# Patient Record
Sex: Male | Born: 1985 | Race: White | Hispanic: No | Marital: Single | Smoking: Never smoker
Health system: Southern US, Community
[De-identification: ages and names within clinical notes are randomized; demographics above are authoritative.]

## PROBLEM LIST (undated history)

## (undated) ENCOUNTER — Emergency Department (HOSPITAL_COMMUNITY): Admission: EM | Payer: MEDICAID | Source: Home / Self Care

---

## 2013-10-25 DIAGNOSIS — S62329A Displaced fracture of shaft of unspecified metacarpal bone, initial encounter for closed fracture: Secondary | ICD-10-CM | POA: Insufficient documentation

## 2013-10-25 DIAGNOSIS — Y929 Unspecified place or not applicable: Secondary | ICD-10-CM | POA: Insufficient documentation

## 2013-10-25 DIAGNOSIS — S63056A Dislocation of other carpometacarpal joint of unspecified hand, initial encounter: Secondary | ICD-10-CM | POA: Insufficient documentation

## 2013-10-25 DIAGNOSIS — W2209XA Striking against other stationary object, initial encounter: Secondary | ICD-10-CM | POA: Insufficient documentation

## 2013-10-26 ENCOUNTER — Ambulatory Visit (HOSPITAL_COMMUNITY)
Admission: EM | Admit: 2013-10-26 | Discharge: 2013-10-26 | Disposition: A | Discharge status: Home / Self Care | Payer: Self-pay | Attending: Emergency Medicine | Admitting: Emergency Medicine

## 2013-10-26 ENCOUNTER — Emergency Department (HOSPITAL_COMMUNITY): Payer: Self-pay | Attending: Emergency Medicine

## 2013-10-26 ENCOUNTER — Encounter (HOSPITAL_COMMUNITY)
Admission: EM | Disposition: A | Discharge status: Home / Self Care | Payer: Self-pay | Source: Home / Self Care | Attending: Emergency Medicine

## 2013-10-26 ENCOUNTER — Encounter (HOSPITAL_COMMUNITY): Payer: Self-pay | Admitting: Emergency Medicine

## 2013-10-26 ENCOUNTER — Emergency Department (HOSPITAL_COMMUNITY): Payer: Self-pay | Admitting: Certified Registered"

## 2013-10-26 ENCOUNTER — Encounter (HOSPITAL_COMMUNITY): Payer: Self-pay | Admitting: Certified Registered"

## 2013-10-26 DIAGNOSIS — S6291XA Unspecified fracture of right wrist and hand, initial encounter for closed fracture: Secondary | ICD-10-CM

## 2013-10-26 HISTORY — PX: PERCUTANEOUS PINNING: SHX2209

## 2013-10-26 HISTORY — PX: I & D EXTREMITY: SHX5045

## 2013-10-26 SURGERY — PINNING, EXTREMITY, PERCUTANEOUS
Anesthesia: General | Laterality: Right

## 2013-10-26 MED ORDER — CEFAZOLIN SODIUM-DEXTROSE 2-3 GM-% IV SOLR
INTRAVENOUS | Status: DC | PRN
  Administered 2013-10-26: 2 g via INTRAVENOUS

## 2013-10-26 MED ORDER — DEXAMETHASONE SODIUM PHOSPHATE 4 MG/ML IJ SOLN
INTRAMUSCULAR | Status: DC | PRN
  Administered 2013-10-26: 4 mg via INTRAVENOUS

## 2013-10-26 MED ORDER — MIDAZOLAM HCL 5 MG/5ML IJ SOLN
INTRAMUSCULAR | Status: DC | PRN
  Administered 2013-10-26: 2 mg via INTRAVENOUS

## 2013-10-26 MED ORDER — LIDOCAINE HCL (CARDIAC) 20 MG/ML IV SOLN
INTRAVENOUS | Status: DC | PRN
  Administered 2013-10-26: 100 mg via INTRAVENOUS

## 2013-10-26 MED ORDER — BUPIVACAINE HCL (PF) 0.25 % IJ SOLN
INTRAMUSCULAR | Status: AC
  Filled 2013-10-26: qty 30

## 2013-10-26 MED ORDER — SODIUM CHLORIDE 0.9 % IR SOLN
Status: DC | PRN
  Administered 2013-10-26: 1000 mL

## 2013-10-26 MED ORDER — SUFENTANIL CITRATE 50 MCG/ML IV SOLN
INTRAVENOUS | Status: DC | PRN
  Administered 2013-10-26: 10 ug via INTRAVENOUS
  Administered 2013-10-26: 20 ug via INTRAVENOUS

## 2013-10-26 MED ORDER — ONDANSETRON HCL 4 MG/2ML IJ SOLN
4.0000 mg | Freq: Four times a day (QID) | INTRAMUSCULAR | Status: AC | PRN
  Administered 2013-10-26: 4 mg via INTRAVENOUS

## 2013-10-26 MED ORDER — LIDOCAINE HCL (CARDIAC) 20 MG/ML IV SOLN
INTRAVENOUS | Status: AC
  Filled 2013-10-26: qty 5

## 2013-10-26 MED ORDER — BUPIVACAINE HCL (PF) 0.25 % IJ SOLN
INTRAMUSCULAR | Status: DC | PRN
  Administered 2013-10-26: 10 mL

## 2013-10-26 MED ORDER — ONDANSETRON HCL 4 MG/2ML IJ SOLN
INTRAMUSCULAR | Status: AC
  Filled 2013-10-26: qty 2

## 2013-10-26 MED ORDER — MORPHINE SULFATE 4 MG/ML IJ SOLN
4.0000 mg | Freq: Once | INTRAMUSCULAR | Status: AC
  Administered 2013-10-26: 4 mg via INTRAVENOUS
  Filled 2013-10-26: qty 1

## 2013-10-26 MED ORDER — HYDROMORPHONE HCL PF 1 MG/ML IJ SOLN: 0.2500 mg | INTRAMUSCULAR | Status: DC | PRN

## 2013-10-26 MED ORDER — SUFENTANIL CITRATE 50 MCG/ML IV SOLN
INTRAVENOUS | Status: AC
  Filled 2013-10-26: qty 1

## 2013-10-26 MED ORDER — ONDANSETRON HCL 4 MG/2ML IJ SOLN
INTRAMUSCULAR | Status: DC | PRN
  Administered 2013-10-26: 4 mg via INTRAVENOUS

## 2013-10-26 MED ORDER — PROPOFOL 10 MG/ML IV BOLUS
INTRAVENOUS | Status: DC | PRN
  Administered 2013-10-26: 200 mg via INTRAVENOUS

## 2013-10-26 MED ORDER — PROPOFOL 10 MG/ML IV BOLUS
INTRAVENOUS | Status: AC
  Filled 2013-10-26: qty 20

## 2013-10-26 MED ORDER — OXYCODONE HCL 5 MG/5ML PO SOLN: 5.0000 mg | Freq: Once | ORAL | Status: DC | PRN

## 2013-10-26 MED ORDER — DEXAMETHASONE SODIUM PHOSPHATE 4 MG/ML IJ SOLN
INTRAMUSCULAR | Status: AC
  Filled 2013-10-26: qty 1

## 2013-10-26 MED ORDER — SODIUM CHLORIDE 0.9 % IJ SOLN
INTRAMUSCULAR | Status: AC
  Filled 2013-10-26: qty 10

## 2013-10-26 MED ORDER — LACTATED RINGERS IV SOLN
INTRAVENOUS | Status: DC | PRN
  Administered 2013-10-26: 04:00:00 via INTRAVENOUS

## 2013-10-26 MED ORDER — SUCCINYLCHOLINE CHLORIDE 20 MG/ML IJ SOLN
INTRAMUSCULAR | Status: DC | PRN
  Administered 2013-10-26: 100 mg via INTRAVENOUS

## 2013-10-26 MED ORDER — SUCCINYLCHOLINE CHLORIDE 20 MG/ML IJ SOLN
INTRAMUSCULAR | Status: AC
  Filled 2013-10-26: qty 1

## 2013-10-26 MED ORDER — MIDAZOLAM HCL 2 MG/2ML IJ SOLN
INTRAMUSCULAR | Status: AC
  Filled 2013-10-26: qty 2

## 2013-10-26 MED ORDER — OXYCODONE HCL 5 MG PO TABS: 5.0000 mg | ORAL_TABLET | Freq: Once | ORAL | Status: DC | PRN

## 2013-10-26 MED ORDER — OXYCODONE-ACETAMINOPHEN 5-325 MG PO TABS: ORAL_TABLET | ORAL | Status: AC

## 2013-10-26 SURGICAL SUPPLY — 75 items
BANDAGE COBAN STERILE 2 (GAUZE/BANDAGES/DRESSINGS) IMPLANT
BANDAGE CONFORM 2  STR LF (GAUZE/BANDAGES/DRESSINGS) IMPLANT
BANDAGE ELASTIC 3 VELCRO ST LF (GAUZE/BANDAGES/DRESSINGS) ×3 IMPLANT
BANDAGE ELASTIC 4 VELCRO ST LF (GAUZE/BANDAGES/DRESSINGS) ×3 IMPLANT
BANDAGE GAUZE ELAST BULKY 4 IN (GAUZE/BANDAGES/DRESSINGS) ×3 IMPLANT
BENZOIN TINCTURE PRP APPL 2/3 (GAUZE/BANDAGES/DRESSINGS) ×3 IMPLANT
BLADE 10 SAFETY STRL DISP (BLADE) ×3 IMPLANT
BLADE SURG ROTATE 9660 (MISCELLANEOUS) ×3 IMPLANT
BNDG COHESIVE 1X5 TAN STRL LF (GAUZE/BANDAGES/DRESSINGS) IMPLANT
BNDG ESMARK 4X9 LF (GAUZE/BANDAGES/DRESSINGS) ×3 IMPLANT
CLOSURE WOUND 1/2 X4 (GAUZE/BANDAGES/DRESSINGS) ×1
CORDS BIPOLAR (ELECTRODE) ×3 IMPLANT
COVER SURGICAL LIGHT HANDLE (MISCELLANEOUS) ×3 IMPLANT
CUFF TOURNIQUET SINGLE 18IN (TOURNIQUET CUFF) ×3 IMPLANT
CUFF TOURNIQUET SINGLE 24IN (TOURNIQUET CUFF) IMPLANT
DECANTER SPIKE VIAL GLASS SM (MISCELLANEOUS) ×3 IMPLANT
DRAIN PENROSE 1/4X12 LTX STRL (WOUND CARE) IMPLANT
DRAPE C-ARM MINI 42X72 WSTRAPS (DRAPES) ×3 IMPLANT
DRAPE OEC MINIVIEW 54X84 (DRAPES) ×3 IMPLANT
DRAPE SURG 17X23 STRL (DRAPES) ×3 IMPLANT
DRSG ADAPTIC 3X8 NADH LF (GAUZE/BANDAGES/DRESSINGS) IMPLANT
DRSG EMULSION OIL 3X3 NADH (GAUZE/BANDAGES/DRESSINGS) ×3 IMPLANT
DRSG PAD ABDOMINAL 8X10 ST (GAUZE/BANDAGES/DRESSINGS) ×6 IMPLANT
DURAPREP 26ML APPLICATOR (WOUND CARE) ×3 IMPLANT
GAUZE XEROFORM 1X8 LF (GAUZE/BANDAGES/DRESSINGS) ×3 IMPLANT
GLOVE BIO SURGEON STRL SZ7.5 (GLOVE) ×6 IMPLANT
GLOVE BIOGEL M STER SZ 6 (GLOVE) ×3 IMPLANT
GLOVE BIOGEL PI IND STRL 7.5 (GLOVE) ×1 IMPLANT
GLOVE BIOGEL PI IND STRL 8 (GLOVE) ×1 IMPLANT
GLOVE BIOGEL PI INDICATOR 7.5 (GLOVE) ×2
GLOVE BIOGEL PI INDICATOR 8 (GLOVE) ×2
GOWN BRE IMP PREV XXLGXLNG (GOWN DISPOSABLE) ×3 IMPLANT
GOWN STRL REIN XL XLG (GOWN DISPOSABLE) ×3 IMPLANT
GOWN STRL REUS W/ TWL LRG LVL3 (GOWN DISPOSABLE) ×2 IMPLANT
GOWN STRL REUS W/TWL LRG LVL3 (GOWN DISPOSABLE) ×4
GUIDEWIRE ORTH 6X062XTROC NS (WIRE) ×1 IMPLANT
HANDPIECE INTERPULSE COAX TIP (DISPOSABLE)
K-WIRE .045X45 (WIRE) ×9 IMPLANT
K-WIRE .062 (WIRE) ×2
KIT BASIN OR (CUSTOM PROCEDURE TRAY) ×3 IMPLANT
KIT ROOM TURNOVER OR (KITS) ×3 IMPLANT
LOOP VESSEL MAXI BLUE (MISCELLANEOUS) IMPLANT
LOOP VESSEL MINI RED (MISCELLANEOUS) IMPLANT
MANIFOLD NEPTUNE II (INSTRUMENTS) ×3 IMPLANT
NEEDLE HYPO 25GX1X1/2 BEV (NEEDLE) ×3 IMPLANT
NEEDLE HYPO 25X1 1.5 SAFETY (NEEDLE) IMPLANT
NS IRRIG 1000ML POUR BTL (IV SOLUTION) ×3 IMPLANT
PACK ORTHO EXTREMITY (CUSTOM PROCEDURE TRAY) ×3 IMPLANT
PAD ARMBOARD 7.5X6 YLW CONV (MISCELLANEOUS) ×6 IMPLANT
PAD CAST 4YDX4 CTTN HI CHSV (CAST SUPPLIES) ×1 IMPLANT
PADDING CAST COTTON 4X4 STRL (CAST SUPPLIES) ×2
SCRUB BETADINE 4OZ XXX (MISCELLANEOUS) IMPLANT
SET HNDPC FAN SPRY TIP SCT (DISPOSABLE) IMPLANT
SLING ARM IMMOBILIZER LRG (SOFTGOODS) ×3 IMPLANT
SOLUTION BETADINE 4OZ (MISCELLANEOUS) IMPLANT
SPLINT FIBERGLASS 3X12 (CAST SUPPLIES) ×3 IMPLANT
SPONGE GAUZE 4X4 12PLY (GAUZE/BANDAGES/DRESSINGS) ×3 IMPLANT
SPONGE LAP 18X18 X RAY DECT (DISPOSABLE) ×3 IMPLANT
SPONGE LAP 4X18 X RAY DECT (DISPOSABLE) ×3 IMPLANT
STRIP CLOSURE SKIN 1/2X4 (GAUZE/BANDAGES/DRESSINGS) ×2 IMPLANT
SUCTION FRAZIER TIP 10 FR DISP (SUCTIONS) ×3 IMPLANT
SUT ETHILON 4 0 P 3 18 (SUTURE) IMPLANT
SUT ETHILON 4 0 PS 2 18 (SUTURE) ×3 IMPLANT
SUT MON AB 5-0 P3 18 (SUTURE) IMPLANT
SUT PROLENE 4 0 P 3 18 (SUTURE) IMPLANT
SYR CONTROL 10ML LL (SYRINGE) ×3 IMPLANT
TOWEL OR 17X24 6PK STRL BLUE (TOWEL DISPOSABLE) ×3 IMPLANT
TOWEL OR 17X26 10 PK STRL BLUE (TOWEL DISPOSABLE) ×3 IMPLANT
TUBE ANAEROBIC SPECIMEN COL (MISCELLANEOUS) IMPLANT
TUBE CONNECTING 12'X1/4 (SUCTIONS) ×1
TUBE CONNECTING 12X1/4 (SUCTIONS) ×2 IMPLANT
TUBE FEEDING 5FR 15 INCH (TUBING) IMPLANT
UNDERPAD 30X30 INCONTINENT (UNDERPADS AND DIAPERS) ×3 IMPLANT
WATER STERILE IRR 1000ML POUR (IV SOLUTION) ×3 IMPLANT
YANKAUER SUCT BULB TIP NO VENT (SUCTIONS) ×3 IMPLANT

## 2013-10-26 NOTE — Anesthesia Preprocedure Evaluation (Signed)
Anesthesia Evaluation  Patient identified by MRN, date of birth, ID band Patient awake    Reviewed: Allergy & Precautions, H&P , NPO status , Patient's Chart, lab work & pertinent test results  Airway Mallampati: I  Neck ROM: full    Dental   Pulmonary neg pulmonary ROS,          Cardiovascular negative cardio ROS      Neuro/Psych    GI/Hepatic   Endo/Other    Renal/GU      Musculoskeletal   Abdominal   Peds  Hematology   Anesthesia Other Findings   Reproductive/Obstetrics                           Anesthesia Physical Anesthesia Plan  ASA: I and emergent  Anesthesia Plan: General   Post-op Pain Management:    Induction: Intravenous  Airway Management Planned: Oral ETT  Additional Equipment:   Intra-op Plan:   Post-operative Plan: Extubation in OR  Informed Consent: I have reviewed the patients History and Physical, chart, labs and discussed the procedure including the risks, benefits and alternatives for the proposed anesthesia with the patient or authorized representative who has indicated his/her understanding and acceptance.     Plan Discussed with: CRNA, Anesthesiologist and Surgeon  Anesthesia Plan Comments:         Anesthesia Quick Evaluation

## 2013-10-26 NOTE — ED Notes (Addendum)
Pt to be seen at [facility] ED.  Dr Bebe ShaggyWickline arranged for accepting physician to see pt in ED.

## 2013-10-26 NOTE — Op Note (Signed)
Intra-operative fluoroscopic images in the AP, lateral, and oblique views were taken and evaluated by myself.  Reduction and hardware placement were confirmed.  There was no intraarticular penetration of permanent hardware.  

## 2013-10-26 NOTE — Op Note (Signed)
[id number] 

## 2013-10-26 NOTE — H&P (Signed)
  Margaretha SheffieldRyan Iezzi is an 28 y.o. male.   Chief Complaint: right hand metacarpal fractures HPI: 28 yo rhd male states he punched a wall late last night injuring right hand.  Seen at [facility] where XR revealed right long/ring metacarpal base fractures and small cmc dislocation.  [facility].  Reports no previous injury to right hand and no other injury at this time.  History reviewed. No pertinent past medical history.  History reviewed. No pertinent past surgical history.  No family history on file. Social History:  reports that he has never smoked. He does not have any smokeless tobacco history on file. He reports that he uses illicit drugs (Marijuana). He reports that he does not drink alcohol.  Allergies: No Known Allergies   (Not in a hospital admission)  No results found for this or any previous visit (from the past 48 hour(s)).  Dg Hand Complete Right  10/26/2013   CLINICAL DATA:  Right hand pain with deformity after punching a wall.  EXAM: RIGHT HAND - COMPLETE 3+ VIEW  COMPARISON:  None.  FINDINGS: Fractures of the base of the right fourth and fifth metacarpal bones with dorsal dislocation of the fifth metacarpal with respect to the carpus and the volar angulation of the distal fracture fragments. There is probably also a fracture of the proximal third metacarpal bone with volar angulation. This is best visualized on the lateral view bone overlap.  IMPRESSION: Angulated fractures of the proximal third, fourth, and fifth metacarpal bones with additional dorsal displacement of the fifth carpometacarpal joint.   Electronically Signed   By: Burman NievesWilliam  Stevens M.D.   On: 10/26/2013 00:28     A comprehensive review of systems was negative.  Blood pressure 131/81, pulse 95, temperature 97.9 F (36.6 C), temperature source Oral, resp. rate 18, SpO2 94.00%.  General appearance: alert, cooperative and appears stated age Head: Normocephalic, without obvious abnormality,  atraumatic Neck: supple, symmetrical, trachea midline Resp: clear to auscultation bilaterally Cardio: regular rate and rhythm GI: non tender Extremities: intact sensation and capillary refill all digits.  +epl/fpl/io.  visible deformity right hand.  mild pain with motion of fingers.   Pulses: 2+ and symmetric Skin: Skin color, texture, turgor normal. No rashes or lesions Neurologic: Grossly normal Incision/Wound: none  Assessment/Plan Right small cmc dislocation, ring and long metacarpal base fractures.  Recommend OR for reduction and pinning of fractures/dislocation.  Risks, benefits, and alternatives of surgery were discussed and the patient agrees with the plan of care.  Don't suspect compartment syndrome of hand at this time therefore no plan for fasciotomies.   Tami RibasKevin R Candelario Steppe 10/26/2013, 3:53 AM

## 2013-10-26 NOTE — Discharge Instructions (Signed)

## 2013-10-26 NOTE — ED Provider Notes (Signed)
CSN: [id number]     Arrival date & time 10/25/13  2351 History  This chart was scribed for Joya Gaskinsonald W Griselda Bramblett, MD by Danella Maiersaroline Early, ED Scribe. This patient was seen in room [facility] and the patient's care was started at 12:34 AM.    No chief complaint on file.  Patient is a 28 y.o. male presenting with hand injury. The history is provided by the patient. No language interpreter was used.  Hand Injury  HPI Comments: Dwayne Ortega is a 28 y.o. male who presents to the Emergency Department complaining of constant moderate right hand pain since hitting a wall around 11PM tonight. Isolated injury. He denies head neck back chest or abdominal pain. He had a light dinner hours ago.  PMH - none No family history on file. History  Substance Use Topics  . Smoking status: Never Smoker   . Smokeless tobacco: Not on file  . Alcohol Use: No    Review of Systems  Gastrointestinal: Negative for vomiting.  Musculoskeletal: Positive for arthralgias (right hand).  Skin: Negative for rash.  Neurological: Negative for weakness and numbness.  All other systems reviewed and are negative.     Allergies  Review of patient's allergies indicates no known allergies.  Home Medications   Prior to Admission medications   Not on File   BP 116/80  Pulse 70  Temp(Src) 98.1 F (36.7 C) (Oral)  Resp 18  SpO2 98% Physical Exam CONSTITUTIONAL: Well developed/well nourished HEAD: Normocephalic/atraumatic EYES: EOMI/PERRL ENMT: Mucous membranes moist NECK: supple no meningeal signs SPINE:entire spine nontender CV: S1/S2 noted, no murmurs/rubs/gallops noted LUNGS: Lungs are clear to auscultation bilaterally, no apparent distress ABDOMEN: soft, nontender, no rebound or guarding GU:no cva tenderness NEURO: Pt is awake/alert, moves all extremitiesx4 EXTREMITIES: pulses normal. Swelling and deformity noted to right hand. No lacerations noted. Distal cap refill less than 2 seconds.  No sensory deficit to  fingers on right hand SKIN: warm, color normal PSYCH: no abnormalities of mood noted   ED Course  Procedures  Medications  morphine 4 MG/ML injection 4 mg (4 mg Intravenous Given 10/26/13 0054)  morphine 4 MG/ML injection 4 mg (4 mg Intravenous Given 10/26/13 0151)      COORDINATION OF CARE: 12:37 AM- Discussed treatment plan with pt which includes pain management and consult to surgeon. Pt agrees to plan.  1:37 AM D/w dr Hilda Liaskeeling who has reviewed imaging He requests Hand consultation 2:04 AM D/w dr Betha Loakevin kuzma He has reviewed xray He requests volar splint and transfer to [place] for operative repair Pt will be transferred via private vehicle Pt agreeable for transfer He is currently stable for transfer BP 116/80  Pulse 70  Temp(Src) 98.1 F (36.7 C) (Oral)  Resp 18  SpO2 98%   Imaging Review Dg Hand Complete Right  10/26/2013   CLINICAL DATA:  Right hand pain with deformity after punching a wall.  EXAM: RIGHT HAND - COMPLETE 3+ VIEW  COMPARISON:  None.  FINDINGS: Fractures of the base of the right fourth and fifth metacarpal bones with dorsal dislocation of the fifth metacarpal with respect to the carpus and the volar angulation of the distal fracture fragments. There is probably also a fracture of the proximal third metacarpal bone with volar angulation. This is best visualized on the lateral view bone overlap.  IMPRESSION: Angulated fractures of the proximal third, fourth, and fifth metacarpal bones with additional dorsal displacement of the fifth carpometacarpal joint.   Electronically Signed   By: Chrissie NoaWilliam  Andria MeuseStevens M.D.   On: 10/26/2013 00:28      MDM   Final diagnoses:  Right hand fracture    Nursing notes including past medical history and social history reviewed and considered in documentation xrays reviewed and considered   I personally performed the services described in this documentation, which was scribed in my presence. The recorded information has been  reviewed and is accurate.      Joya Gaskinsonald W Alekhya Gravlin, MD 10/26/13 [phone]

## 2013-10-26 NOTE — Op Note (Signed)
NAMMarland Kitchen:  Dwayne Ortega, Dwayne Ortega                 ACCOUNT NO.:  [id number]  MEDICAL RECORD NO.:  [id number]  LOCATION:  [facility]                         FACILITY:  [facility]  PHYSICIAN:  Betha LoaKevin Citlali Gautney, MD        DATE OF BIRTH:  1986/02/24  DATE OF PROCEDURE:  10/26/2013 DATE OF DISCHARGE:                              OPERATIVE REPORT   PREOPERATIVE DIAGNOSIS:  Right long and ring finger metacarpal fractures and small finger carpometacarpal joint dislocation.  POSTOPERATIVE DIAGNOSIS:  Right long and ring finger metacarpal fractures and small finger carpometacarpal joint dislocation.  PROCEDURE:  Closed reduction and percutaneous pinning of right long and ring finger metacarpal fractures and right small finger carpometacarpal joint dislocation.  SURGEON:  Betha LoaKevin Kron Everton, MD  ASSISTANT:  None.  ANESTHESIA:  General.  IV FLUIDS:  Per anesthesia flow sheet.  ESTIMATED BLOOD LOSS:  Minimal.  COMPLICATIONS:  None.  SPECIMENS:  None.  TOURNIQUET TIME:  29 minutes.  DISPOSITION:  Stable to PACU.  INDICATIONS:  [name] is a 28 year old male who states that last evening he punched a wall, injured his right hand.  [facility] was [facility] in [facility] where radiographs were taken revealing a right small finger CMC dislocation and long and ring finger metacarpal fractures.  I was consulted for management of the injury.  [facility].  On examination, he had intact sensation and capillary refill on the fingertips.  He can flex and extend the IP joint of thumbs across his fingers.  He had no open wounds.  He had visible deformity of the right hand.  Passive motion of the fingers did not cause significant pain.  I recommended operative closed reduction and pinning versus open reduction and fixation of the fractures and dislocation.  Risks, benefits and alternatives of the surgery were discussed including the risk of blood loss; infection; damage to nerves,  vessels, tendons, ligaments, bone; failure of surgery; need for additional surgery; complications with wound healing; continued pain; nonunion; malunion; stiffness.  He voiced understanding of these risks and elected to proceed.  OPERATIVE COURSE:  After being identified preoperatively by myself, the patient and I agreed upon the procedure and site of procedure.  Surgical site was marked.  The risks, benefits, and alternatives of the surgery were reviewed and he wished to proceed.  Surgical consent had been signed.  He was given IV Ancef as preoperative antibiotic prophylaxis. He was transferred to the operating room and placed on the operating room table in supine position with the right upper extremity on an armboard.  General anesthesia was induced by anesthesiologist.  Right upper extremity was prepped and draped in normal sterile orthopedic fashion.  A surgical pause was performed between the surgeons, anesthesia, and operating room staff, and all were in agreement as to the patient, procedure, and site of procedure.  Tourniquet at the proximal aspect of the extremity was inflated to 250 mmHg after exsanguination of the limb with an Esmarch bandage.  Closed reduction of the CMC dislocation in the fractures was performed.  Near-anatomic reduction was obtained.  A 0.062 inch K-wire was then advanced across the [facility]  joint of the small finger.  This was adequate to stabilize the dislocation.  An additional 0.045-inch K-wire was then advanced from the base of small finger metacarpal into the ring finger metacarpal base. 0.045-inch K-wires were then used in a crossed fashion at the ring and long finger metacarpal base fractures.  This provided good stabilization of the fractures.  C-arm was used in AP, lateral, and oblique projections throughout the case to ensure appropriate reduction and position of hardware, which was the case.  The wrist was placed through a tenodesis and there was no  scissoring of the digits.  All pins were bent and cut short.  The pin sites were dressed with sterile Xeroform, 4x4s, and wrapped with a Kerlix bandage.  A volar and dorsal slab splint including the long, ring, and small fingers with the MPs flexed and the IPs extended was placed and wrapped with Kerlix and Ace bandage. Tourniquet was deflated at 29 minutes.  Fingertips were pink with brisk capillary refill after deflation of the tourniquet.  The operative drapes were broken down.  The patient was awoken from anesthesia safely. He was transferred back to stretcher and taken to PACU in stable condition.  I will see him back in the office in 1 week for postoperative followup.  I will give him Percocet 5/325, 1-2 p.o. q.6 hours p.r.n. pain, dispensed #40.     Betha LoaKevin Kellyann Ordway, MD     KK/MEDQ  D:  10/26/2013  T:  10/26/2013  Job:  [id number]

## 2013-10-26 NOTE — ED Provider Notes (Signed)
SPLINT APPLICATION Date/Time: 2:11 AM Authorized by: Joya Gaskinsonald W Dyron Kawano Consent: Verbal consent obtained. Risks and benefits: risks, benefits and alternatives were discussed Consent given by: patient Splint applied by: nurse  Location details: right hand Splint type: volar splint Supplies used: fiberglass Post-procedure: The splinted body part was neurovascularly unchanged following the procedure. Patient tolerance: Patient tolerated the procedure well with no immediate complications.     Joya Gaskinsonald W Kacee Koren, MD 10/26/13 [id number]

## 2013-10-26 NOTE — Transfer of Care (Signed)
Immediate Anesthesia Transfer of Care Note  Patient: Margaretha SheffieldRyan Dascenzo  Procedure(s) Performed: Procedure(s): PERCUTANEOUS PINNING EXTREMITY (Right) IRRIGATION AND DEBRIDEMENT EXTREMITY (Right)  Patient Location: PACU  Anesthesia Type:General  Level of Consciousness: awake, alert , oriented and patient cooperative  Airway & Oxygen Therapy: Patient Spontanous Breathing and Patient connected to nasal cannula oxygen  Post-op Assessment: Report given to PACU RN, Post -op Vital signs reviewed and stable and Patient moving all extremities X 4  Post vital signs: Reviewed and stable  Complications: No apparent anesthesia complications

## 2013-10-26 NOTE — Brief Op Note (Signed)
10/26/2013  5:11 AM  PATIENT:  Dwayne Ortega  28 y.o. male  PRE-OPERATIVE DIAGNOSIS:  fractured right hand  POST-OPERATIVE DIAGNOSIS:  fractured right hand  PROCEDURE:  Procedure(s): PERCUTANEOUS PINNING EXTREMITY (Right) IRRIGATION AND DEBRIDEMENT EXTREMITY (Right)  SURGEON:  Surgeon(s) and Role:    * Tami RibasKevin R Franchelle Foskett, MD - Primary  PHYSICIAN ASSISTANT:   ASSISTANTS: none   ANESTHESIA:   general  EBL:     BLOOD ADMINISTERED:none  DRAINS: none   LOCAL MEDICATIONS USED:  MARCAINE     SPECIMEN:  No Specimen  DISPOSITION OF SPECIMEN:  N/A  COUNTS:  YES  TOURNIQUET:   Total Tourniquet Time Documented: Upper Arm (Right) - 29 minutes Total: Upper Arm (Right) - 29 minutes   DICTATION: .Other Dictation: Dictation Number [id number]  PLAN OF CARE: Discharge to home after PACU  PATIENT DISPOSITION:  PACU - hemodynamically stable.

## 2013-10-26 NOTE — ED Notes (Signed)
Pt states he hit a wall with his right hand approx 1 hour ago, has obvious deformity to same.

## 2013-10-29 NOTE — Anesthesia Postprocedure Evaluation (Signed)
Anesthesia Post Note  Patient: Dwayne SheffieldRyan Coldren  Procedure(s) Performed: Procedure(s) (LRB): PERCUTANEOUS PINNING EXTREMITY (Right) IRRIGATION AND DEBRIDEMENT EXTREMITY (Right)  Anesthesia type: General  Patient location: PACU  Post pain: Pain level controlled and Adequate analgesia  Post assessment: Post-op Vital signs reviewed, Patient's Cardiovascular Status Stable, Respiratory Function Stable, Patent Airway and Pain level controlled  Last Vitals:  Filed Vitals:   10/26/13 0615  BP: 120/78  Pulse: 76  Temp: 36.4 C  Resp: 16    Post vital signs: Reviewed and stable  Level of consciousness: awake, alert  and oriented  Complications: No apparent anesthesia complications

## 2013-10-30 ENCOUNTER — Encounter (HOSPITAL_COMMUNITY): Payer: Self-pay | Admitting: Orthopedic Surgery

## 2015-11-23 IMAGING — CR DG HAND COMPLETE 3+V*R*
4 series · 4 of 4 positions shown · non-contrast
Comparison: None.

CLINICAL DATA: Right hand pain with deformity after punching a
wall.

EXAM:
RIGHT HAND - COMPLETE 3+ VIEW

[view not recorded (1 of 4)]
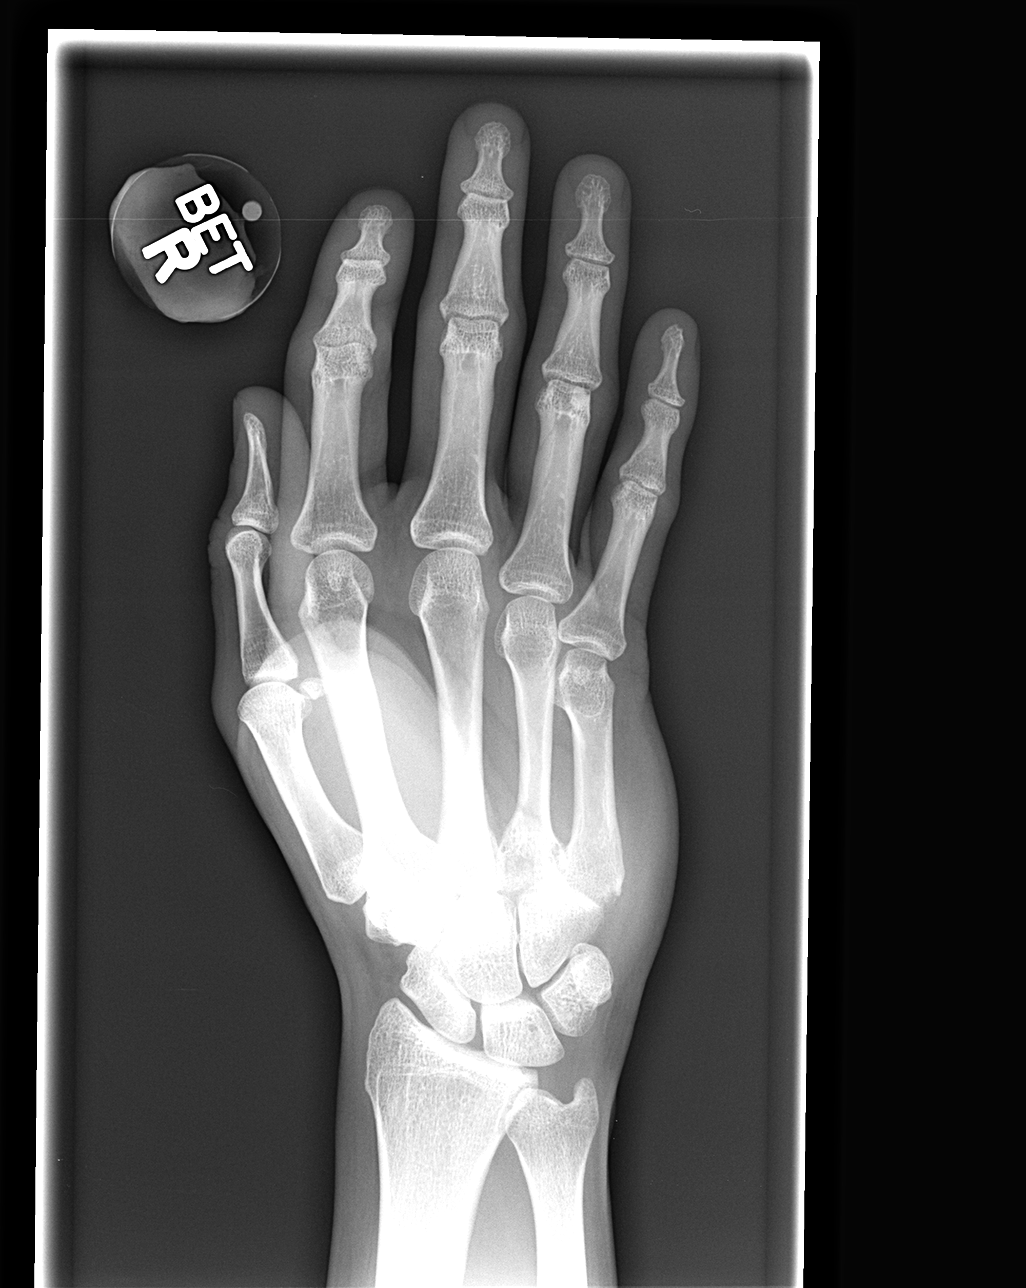

[view not recorded (2 of 4)]
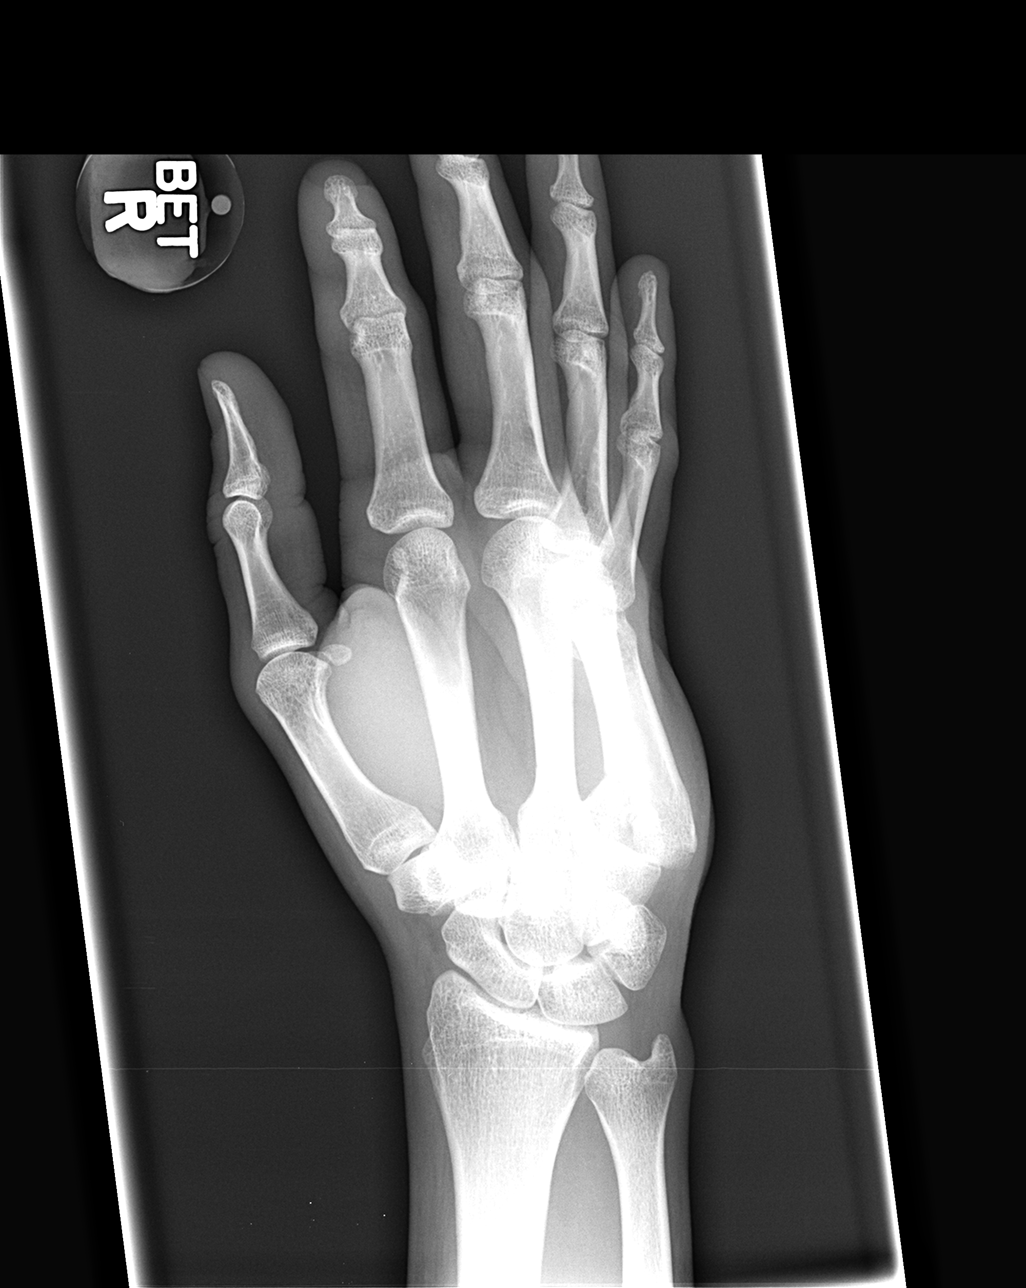

[view not recorded (3 of 4)]
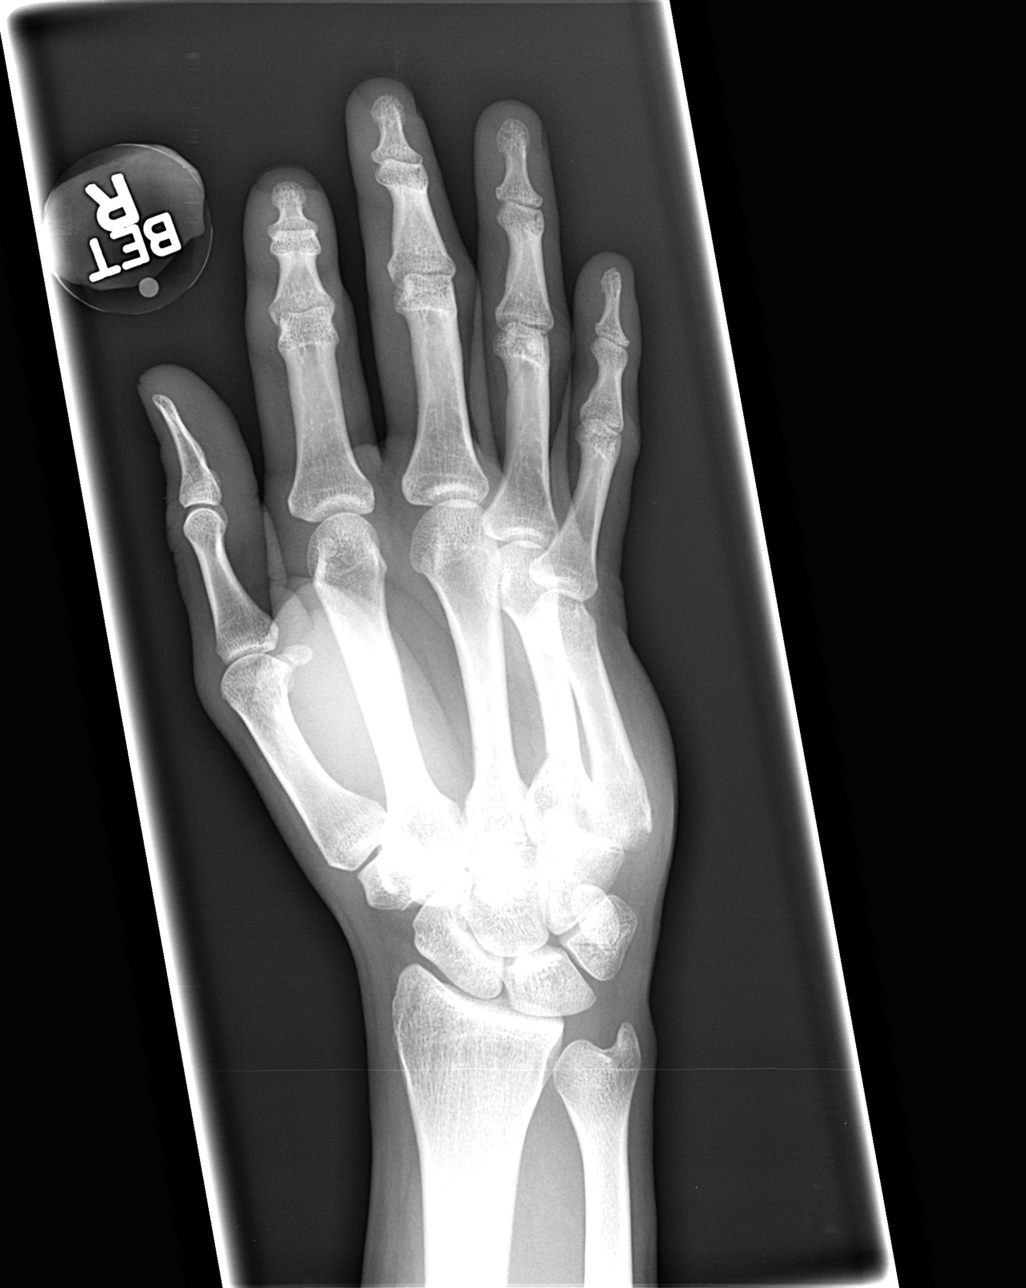

[view not recorded (4 of 4)]
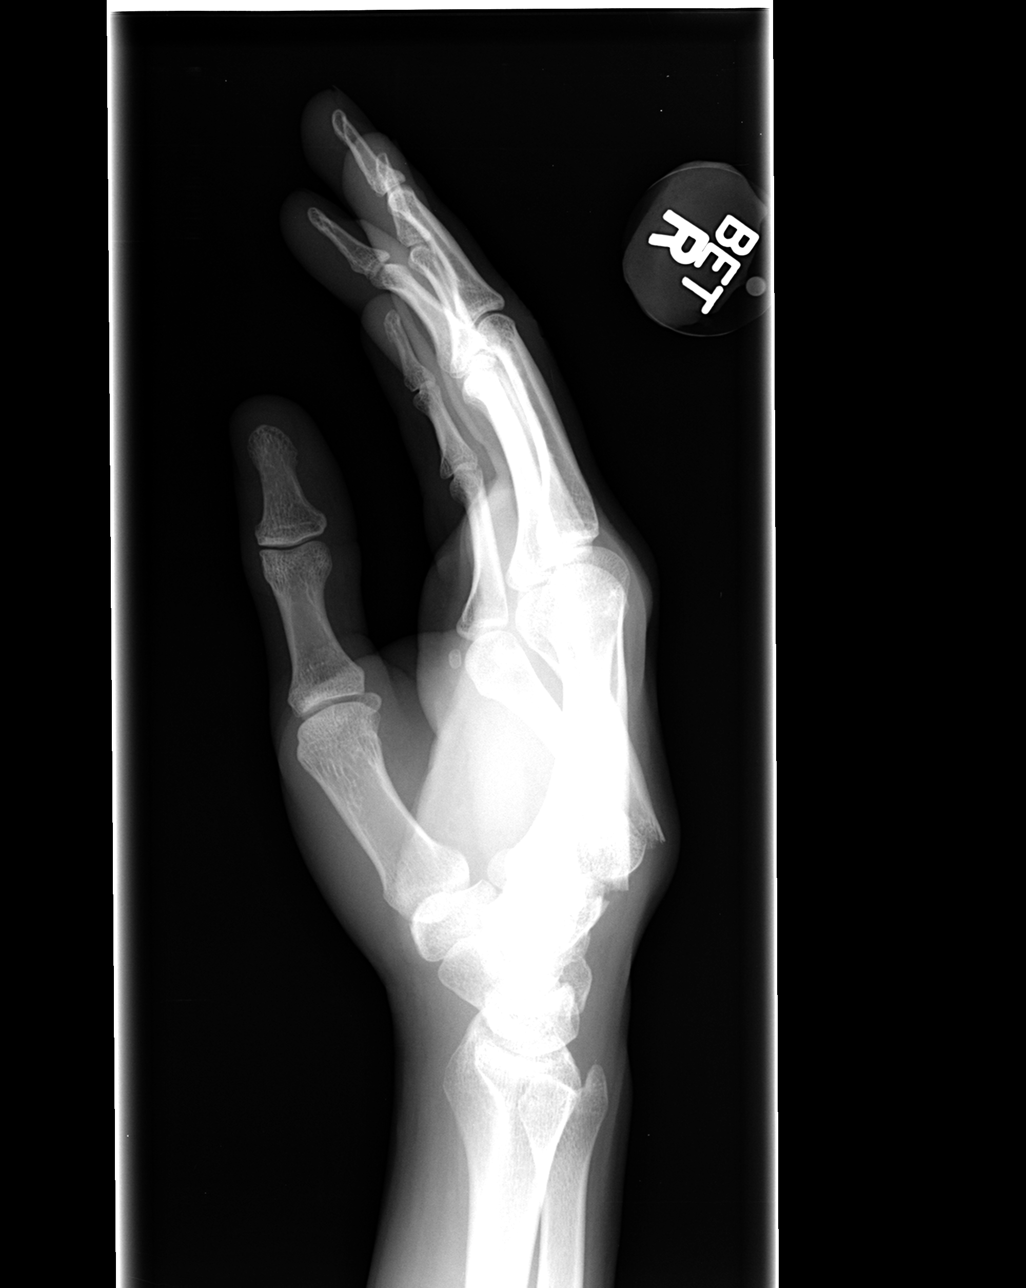

[4 of 4 positions shown; findings below may reference images not displayed]

FINDINGS: Fractures of the base of the right fourth and fifth metacarpal bones
with dorsal dislocation of the fifth metacarpal with respect to the
carpus and the volar angulation of the distal fracture fragments.
There is probably also a fracture of the proximal third metacarpal
bone with volar angulation. This is best visualized on the lateral
view bone overlap.
IMPRESSION: Angulated fractures of the proximal third, fourth, and fifth
metacarpal bones with additional dorsal displacement of the fifth
carpometacarpal joint.
